# Patient Record
Sex: Male | Born: 1950 | Race: White | Hispanic: No | Marital: Married
Health system: Southern US, Community
[De-identification: ages and names within clinical notes are randomized; demographics above are authoritative.]

---

## 2000-03-29 ENCOUNTER — Encounter: Payer: Self-pay | Admitting: Internal Medicine

## 2000-03-29 ENCOUNTER — Ambulatory Visit (HOSPITAL_COMMUNITY): Admission: RE | Admit: 2000-03-29 | Discharge: 2000-03-29 | Payer: Self-pay | Admitting: Internal Medicine

## 2003-08-16 ENCOUNTER — Ambulatory Visit (HOSPITAL_COMMUNITY): Admission: RE | Admit: 2003-08-16 | Discharge: 2003-08-16 | Payer: Self-pay | Admitting: Internal Medicine

## 2003-08-30 ENCOUNTER — Ambulatory Visit (HOSPITAL_COMMUNITY): Admission: RE | Admit: 2003-08-30 | Discharge: 2003-08-30 | Payer: Self-pay | Admitting: Cardiovascular Disease

## 2009-02-01 ENCOUNTER — Encounter: Admission: RE | Admit: 2009-02-01 | Discharge: 2009-02-01 | Payer: Self-pay | Admitting: Internal Medicine

## 2010-11-05 ENCOUNTER — Encounter: Payer: Self-pay | Admitting: Internal Medicine

## 2011-03-02 NOTE — Cardiovascular Report (Signed)
NAME:  Brandon Washington, Brandon Washington                            ACCOUNT NO.:  [id number]   MEDICAL RECORD NO.:  [id number]                   PATIENT TYPE:  OIB   LOCATION:  2899                                 FACILITY:  [facility]   PHYSICIAN:  Richard A. Alanda Amass, M.D.          DATE OF BIRTH:  12-17-1950   DATE OF PROCEDURE:  08/30/2003  DATE OF DISCHARGE:                              CARDIAC CATHETERIZATION   PROCEDURE:  1. Retrograde central aortic catheterization.  2. Selective coronary angiography by Judkins technique.  3. Left ventricular angiogram RAO/LAO projection.  4. Abdominal angiogram mid stream PA projection.  5. Right iliac angiogram oblique projection.  6. VasoSeal 6-French closure device CRFA successful.   DESCRIPTION OF PROCEDURE:  The patient was brought to the second floor CP  laboratory in a postabsorptive state after 5 mg Valium p.o. pre medication.  The right groin was prepped, draped in the usual manner.  1% Xylocaine was  used for local anesthesia.  The patient was given 2 mg of Versed IV for  sedation in the laboratory.  The CRFA was entered with anterior puncture  using 18 thin wall needle and a 6-French __________ sidearm sheath which was  inserted without difficulty.  Catheterization was performed with 6-French 4  cm taper preformed Cordis coronary and pigtail catheters using Omnipaque dye  throughout the procedure.  Automated injections were done with the Bracco  automated injection device using Omnipaque dye throughout the procedure.  LV  angiogram was done in the RAO and LAO projection at 25 mL 14 mL/second of  which approximately 16 mL was delivered.  LAO injection was done at  approximately the same setting.  Pullback pressure of the CA showed no  gradient across the aortic valve.  Abdominal aortic angiogram was done in  the mid stream PA projection above the level of the renal arteries at 25 mL  20 mL/second.  Catheter was removed.  Sidearm sheath was flushed and  the  CRFA puncture was closed with a 6-French AngioSeal closure device  successfully.  The patient was transferred to the holding area for  postoperative care in stable condition with intact right lower extremity  pulses.   IMPRESSION:  LV 115/0.  LVEDP 16 mmHg.   CA 115/70 mmHg.   There is no gradient across the aortic valve on catheter pullback.   LV angiogram in the RAO and LAO projection revealed a normally contracting  ventricle with EF greater than 55%.  No wall motion abnormality.  No mitral  regurgitation.  Normal sized ventricle.   Abdominal aortic angiogram revealed single normal renal arteries  bilaterally, normal infrarenal abdominal aorta with no significant  atherosclerosis, no aneurysm or stenosis.  Normal common external iliacs and  intact hypogastrics bilaterally.  Appeared to be normal runoff and normal  SFA profunda junction bilaterally.  Celiac and SMA accesses were normal and  IMA was intact.   Fluoroscopy did not  reveal any coronary, intracardiac, or valvular  calcification.   The main left coronary was normal.   The LAD was widely patent, smooth, and normal, coursed to the apex of the  heart where it bifurcated.  There was a moderate sized septal perforator  from the proximal third and a large trifurcating diagonal branch just beyond  this that was normal.   There was a large optional diagonal vessel (trifurcation) that trifurcated  and was normal.   The circumflex was nondominant, small marginal and PABG branch that was  normal.   The RCA was a dominant vessel, widely patent, smooth throughout its course.  PDA and PLA were normal as were three RV branches from the mid RCA.   DISCUSSION:  This pleasant 60 year old white married father of three is a  nonsmoker, has hyperlipidemia, and a family history of abdominal aortic  aneurysm (father and two uncles had repair).  He is a runner and up until  the last year he has noted some relative dyspnea on  exertion.  He has been  unable to complete some of his runs which have been two to three miles and  he has cut these back.  He works at [employer] full-time and is a medical  patient of Erskine Speed, M.D.   Outpatient exercise test showed good functional capacity up to 14 minutes  with 2-3 mm of horizontal ST depression at peak exercise, resolving quickly  in the recovery period without significant chest pain.  He underwent  diagnostic catheterization in this setting.  He also had soft right femoral  bruit suggesting possible early peripheral arterial disease.  He is on Zocor  and aspirin.   Catheterization shows normal coronary arteries and LV, no evidence of spasm  or atherosclerotic disease.  There was some minimal irregularities of the  right external iliac artery beyond the sheath insertion which may represent  early atherosclerosis.   In view of his strong family history, I would recommend continued lipid  lowering therapy and aspirin prophylaxis despite his normal anatomy long-  term.  I would probably consider follow-up Cardiolite testing in five years  and recommend repeat abdominal ultrasound at about that time as well because  of his family history.  Reassurance at present.  The etiology of his  relative decline in functional capacity is not clear.   CATHETERIZATION DIAGNOSES:  1. False positive treadmill exercise test with good functional capacity.  2. Normal coronary arteries and left ventricle.  3.     Hyperlipidemia.  4. Family history of abdominal aortic aneurysm.  5. Asymptomatic right femoral bruit with minimal atherosclerotic disease     angiographically.                                               Richard A. Alanda Amass, M.D.    RAW/MEDQ  D:  08/30/2003  T:  08/30/2003  Job:  [id number]   cc:   CP Lab   Erskine Speed, M.D.  [address]  [place]  [place] [address]  Fax: [phone]

## 2013-04-18 ENCOUNTER — Ambulatory Visit (HOSPITAL_COMMUNITY)
Admission: RE | Admit: 2013-04-18 | Discharge: 2013-04-18 | Disposition: A | Discharge status: Home / Self Care | Payer: BC Managed Care – PPO | Source: Ambulatory Visit | Attending: Internal Medicine | Admitting: Internal Medicine

## 2013-04-18 ENCOUNTER — Other Ambulatory Visit (HOSPITAL_COMMUNITY): Payer: Self-pay | Admitting: Internal Medicine

## 2013-04-18 DIAGNOSIS — R52 Pain, unspecified: Secondary | ICD-10-CM

## 2013-04-18 DIAGNOSIS — M25429 Effusion, unspecified elbow: Secondary | ICD-10-CM | POA: Insufficient documentation

## 2013-04-18 DIAGNOSIS — M25539 Pain in unspecified wrist: Secondary | ICD-10-CM | POA: Insufficient documentation

## 2013-04-18 DIAGNOSIS — M25529 Pain in unspecified elbow: Secondary | ICD-10-CM | POA: Insufficient documentation

## 2013-04-18 DIAGNOSIS — S52123A Displaced fracture of head of unspecified radius, initial encounter for closed fracture: Secondary | ICD-10-CM | POA: Insufficient documentation

## 2013-04-18 DIAGNOSIS — S62009A Unspecified fracture of navicular [scaphoid] bone of unspecified wrist, initial encounter for closed fracture: Secondary | ICD-10-CM | POA: Insufficient documentation

## 2013-05-18 ENCOUNTER — Other Ambulatory Visit: Payer: Self-pay | Admitting: Orthopedic Surgery

## 2013-05-18 DIAGNOSIS — M25532 Pain in left wrist: Secondary | ICD-10-CM

## 2013-05-21 ENCOUNTER — Inpatient Hospital Stay
Admission: RE | Admit: 2013-05-21 | Discharge: 2013-05-21 | Disposition: A | Discharge status: Home / Self Care | Payer: BC Managed Care – PPO | Source: Ambulatory Visit | Attending: Orthopedic Surgery | Admitting: Orthopedic Surgery

## 2017-03-06 DIAGNOSIS — Z0000 Encounter for general adult medical examination without abnormal findings: Secondary | ICD-10-CM | POA: Diagnosis not present

## 2017-03-06 DIAGNOSIS — E785 Hyperlipidemia, unspecified: Secondary | ICD-10-CM | POA: Diagnosis not present

## 2017-03-06 DIAGNOSIS — Z6841 Body Mass Index (BMI) 40.0 and over, adult: Secondary | ICD-10-CM | POA: Diagnosis not present

## 2017-06-11 ENCOUNTER — Other Ambulatory Visit: Payer: Self-pay | Admitting: Internal Medicine

## 2017-06-11 ENCOUNTER — Ambulatory Visit
Admission: RE | Admit: 2017-06-11 | Discharge: 2017-06-11 | Disposition: A | Discharge status: Home / Self Care | Payer: PRIVATE HEALTH INSURANCE | Source: Ambulatory Visit | Attending: Internal Medicine | Admitting: Internal Medicine

## 2017-06-11 DIAGNOSIS — R599 Enlarged lymph nodes, unspecified: Secondary | ICD-10-CM

## 2017-06-11 DIAGNOSIS — R221 Localized swelling, mass and lump, neck: Secondary | ICD-10-CM | POA: Diagnosis not present

## 2017-06-11 MED ORDER — IOPAMIDOL (ISOVUE-300) INJECTION 61%
75.0000 mL | Freq: Once | INTRAVENOUS | Status: AC | PRN
  Administered 2017-06-11: 75 mL via INTRAVENOUS

## 2017-07-10 DIAGNOSIS — L565 Disseminated superficial actinic porokeratosis (DSAP): Secondary | ICD-10-CM | POA: Diagnosis not present

## 2017-07-10 DIAGNOSIS — L820 Inflamed seborrheic keratosis: Secondary | ICD-10-CM | POA: Diagnosis not present

## 2017-07-10 DIAGNOSIS — L281 Prurigo nodularis: Secondary | ICD-10-CM | POA: Diagnosis not present

## 2017-07-10 DIAGNOSIS — L821 Other seborrheic keratosis: Secondary | ICD-10-CM | POA: Diagnosis not present

## 2018-03-11 DIAGNOSIS — Z6841 Body Mass Index (BMI) 40.0 and over, adult: Secondary | ICD-10-CM | POA: Diagnosis not present

## 2018-03-11 DIAGNOSIS — E785 Hyperlipidemia, unspecified: Secondary | ICD-10-CM | POA: Diagnosis not present

## 2018-03-11 DIAGNOSIS — Z0000 Encounter for general adult medical examination without abnormal findings: Secondary | ICD-10-CM | POA: Diagnosis not present

## 2018-03-11 DIAGNOSIS — Z1211 Encounter for screening for malignant neoplasm of colon: Secondary | ICD-10-CM | POA: Diagnosis not present

## 2019-07-16 DIAGNOSIS — F4323 Adjustment disorder with mixed anxiety and depressed mood: Secondary | ICD-10-CM | POA: Diagnosis not present

## 2019-07-20 DIAGNOSIS — Z1211 Encounter for screening for malignant neoplasm of colon: Secondary | ICD-10-CM | POA: Diagnosis not present

## 2019-07-20 DIAGNOSIS — E7800 Pure hypercholesterolemia, unspecified: Secondary | ICD-10-CM | POA: Diagnosis not present

## 2019-07-20 DIAGNOSIS — Z23 Encounter for immunization: Secondary | ICD-10-CM | POA: Diagnosis not present

## 2019-07-20 DIAGNOSIS — N401 Enlarged prostate with lower urinary tract symptoms: Secondary | ICD-10-CM | POA: Diagnosis not present

## 2019-09-25 DIAGNOSIS — Z1159 Encounter for screening for other viral diseases: Secondary | ICD-10-CM | POA: Diagnosis not present

## 2019-11-04 ENCOUNTER — Ambulatory Visit: Payer: PRIVATE HEALTH INSURANCE | Attending: Internal Medicine

## 2019-11-04 DIAGNOSIS — Z23 Encounter for immunization: Secondary | ICD-10-CM | POA: Insufficient documentation

## 2019-11-04 NOTE — Progress Notes (Signed)
   Covid-19 Vaccination Clinic  Name:  KAYZEN KENDZIERSKI    MRN: [id number] DOB: [birth year]  11/04/2019  Mr. Amsden was observed post Covid-19 immunization for 15 minutes without incidence. He was provided with Vaccine Information Sheet and instruction to access the V-Safe system.   Mr. Quincy was instructed to call 911 with any severe reactions post vaccine: Marland Kitchen Difficulty breathing  . Swelling of your face and throat  . A fast heartbeat  . A bad rash all over your body  . Dizziness and weakness    Immunizations Administered    Name Date Dose VIS Date Route   Pfizer COVID-19 Vaccine 11/04/2019  8:47 AM 0.3 mL 09/25/2019 Intramuscular   Manufacturer: [employer], [employer]   Lot: [id number]   NDC: [address]-3

## 2019-11-22 ENCOUNTER — Ambulatory Visit: Payer: Self-pay | Attending: Internal Medicine

## 2019-11-22 DIAGNOSIS — Z23 Encounter for immunization: Secondary | ICD-10-CM | POA: Insufficient documentation

## 2019-11-22 NOTE — Progress Notes (Signed)
   Covid-19 Vaccination Clinic  Name:  Brandon Washington    MRN: [id number] DOB: 26-May-1951  11/22/2019  [name] was observed post Covid-19 immunization for 15 minutes without incidence. He was provided with Vaccine Information Sheet and instruction to access the V-Safe system.   [name] was instructed to call 911 with any severe reactions post vaccine: Marland Kitchen Difficulty breathing  . Swelling of your face and throat  . A fast heartbeat  . A bad rash all over your body  . Dizziness and weakness    Immunizations Administered    Name Date Dose VIS Date Route   Pfizer COVID-19 Vaccine 11/22/2019 12:43 PM 0.3 mL 09/25/2019 Intramuscular   Manufacturer: [employer], [employer]   Lot: [id number]   NDC: [address]-1

## 2020-01-07 DIAGNOSIS — E7849 Other hyperlipidemia: Secondary | ICD-10-CM | POA: Diagnosis not present

## 2020-01-07 DIAGNOSIS — Z8249 Family history of ischemic heart disease and other diseases of the circulatory system: Secondary | ICD-10-CM | POA: Diagnosis not present

## 2020-01-07 DIAGNOSIS — Z1331 Encounter for screening for depression: Secondary | ICD-10-CM | POA: Diagnosis not present

## 2020-01-12 ENCOUNTER — Other Ambulatory Visit: Payer: Self-pay | Admitting: Internal Medicine

## 2020-01-12 DIAGNOSIS — Z8249 Family history of ischemic heart disease and other diseases of the circulatory system: Secondary | ICD-10-CM

## 2020-01-12 DIAGNOSIS — E785 Hyperlipidemia, unspecified: Secondary | ICD-10-CM

## 2020-01-13 ENCOUNTER — Ambulatory Visit
Admission: RE | Admit: 2020-01-13 | Discharge: 2020-01-13 | Disposition: A | Discharge status: Home / Self Care | Payer: BC Managed Care – PPO | Source: Ambulatory Visit | Attending: Internal Medicine | Admitting: Internal Medicine

## 2020-01-13 DIAGNOSIS — Z8249 Family history of ischemic heart disease and other diseases of the circulatory system: Secondary | ICD-10-CM

## 2020-01-13 DIAGNOSIS — E785 Hyperlipidemia, unspecified: Secondary | ICD-10-CM | POA: Diagnosis not present

## 2020-01-15 ENCOUNTER — Other Ambulatory Visit: Payer: Self-pay | Admitting: Internal Medicine

## 2020-02-05 DIAGNOSIS — F4323 Adjustment disorder with mixed anxiety and depressed mood: Secondary | ICD-10-CM | POA: Diagnosis not present

## 2020-02-12 DIAGNOSIS — Z1159 Encounter for screening for other viral diseases: Secondary | ICD-10-CM | POA: Diagnosis not present

## 2020-02-17 DIAGNOSIS — Z1211 Encounter for screening for malignant neoplasm of colon: Secondary | ICD-10-CM | POA: Diagnosis not present

## 2020-02-17 DIAGNOSIS — K5730 Diverticulosis of large intestine without perforation or abscess without bleeding: Secondary | ICD-10-CM | POA: Diagnosis not present

## 2020-02-25 DIAGNOSIS — F4323 Adjustment disorder with mixed anxiety and depressed mood: Secondary | ICD-10-CM | POA: Diagnosis not present

## 2020-03-22 DIAGNOSIS — F4323 Adjustment disorder with mixed anxiety and depressed mood: Secondary | ICD-10-CM | POA: Diagnosis not present

## 2020-05-16 DIAGNOSIS — R6883 Chills (without fever): Secondary | ICD-10-CM | POA: Diagnosis not present

## 2020-06-27 DIAGNOSIS — Z20822 Contact with and (suspected) exposure to covid-19: Secondary | ICD-10-CM | POA: Diagnosis not present

## 2020-07-14 DIAGNOSIS — Z0000 Encounter for general adult medical examination without abnormal findings: Secondary | ICD-10-CM | POA: Diagnosis not present

## 2020-07-14 DIAGNOSIS — Z125 Encounter for screening for malignant neoplasm of prostate: Secondary | ICD-10-CM | POA: Diagnosis not present

## 2020-07-14 DIAGNOSIS — E785 Hyperlipidemia, unspecified: Secondary | ICD-10-CM | POA: Diagnosis not present

## 2020-07-21 DIAGNOSIS — E785 Hyperlipidemia, unspecified: Secondary | ICD-10-CM | POA: Diagnosis not present

## 2020-07-21 DIAGNOSIS — Z23 Encounter for immunization: Secondary | ICD-10-CM | POA: Diagnosis not present

## 2020-07-21 DIAGNOSIS — Z0000 Encounter for general adult medical examination without abnormal findings: Secondary | ICD-10-CM | POA: Diagnosis not present

## 2020-11-19 IMAGING — US US AORTA
1 series · 14 of 25 positions shown · non-contrast
Comparison: None

CLINICAL DATA: Family history of abdominal aortic aneurysm,
hyperlipidemia

EXAM:
ULTRASOUND OF ABDOMINAL AORTA
TECHNIQUE: Ultrasound examination of the abdominal aorta and proximal common
iliac arteries was performed to evaluate for aneurysm. Additional
color and Doppler images of the distal aorta were obtained to
document patency.

[Series 1: us aorta · 0.33mm/px · 14 of 27 slices shown]
[im 1/27]
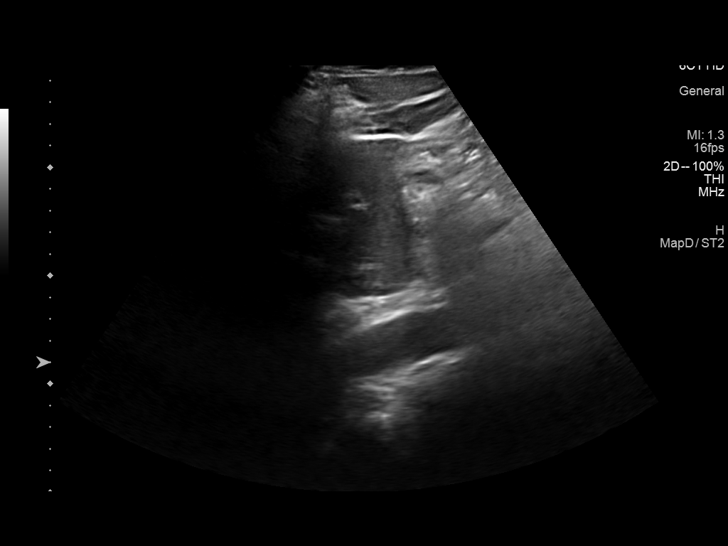
[im 3/27]
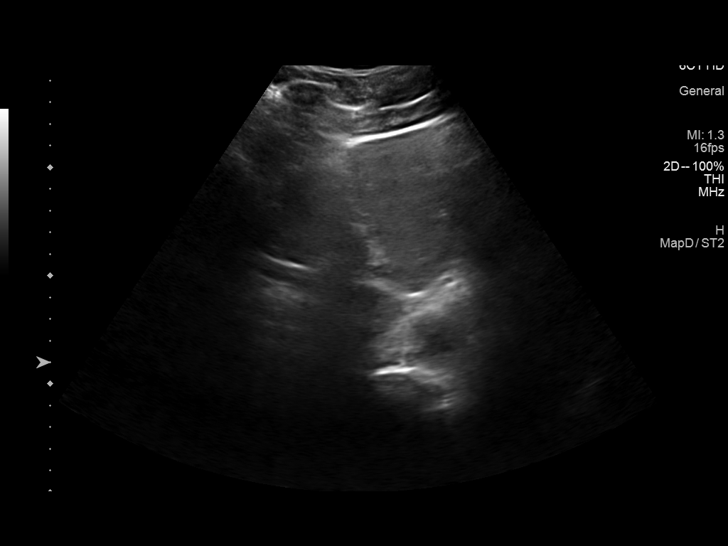
[im 5/27]
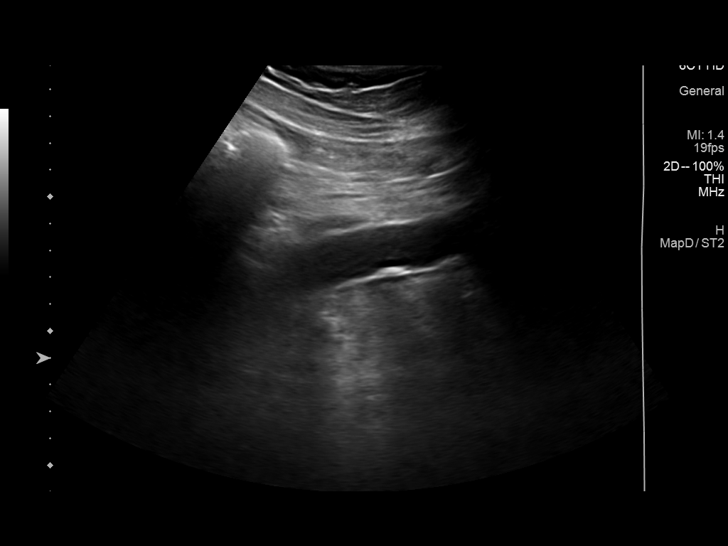
[im 7/27]
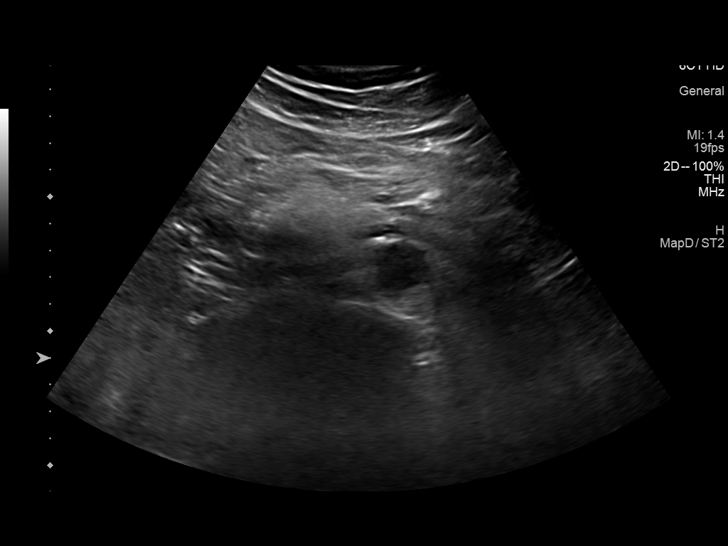
[im 9/27]
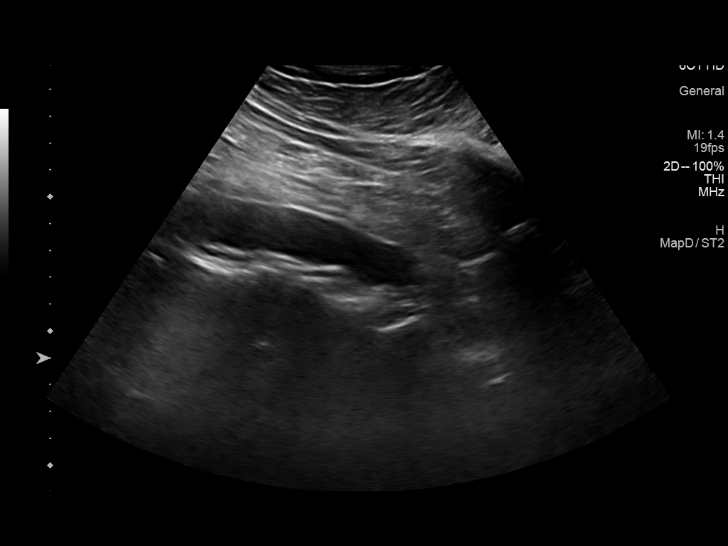
[im 10/27]
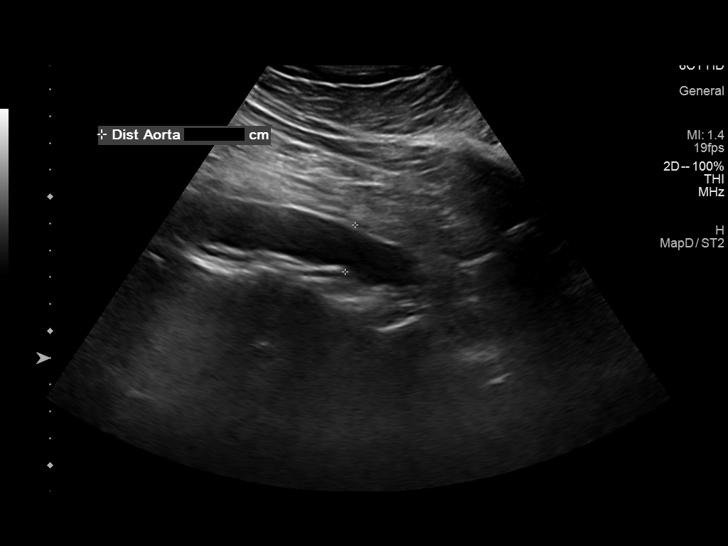
[im 12/27]
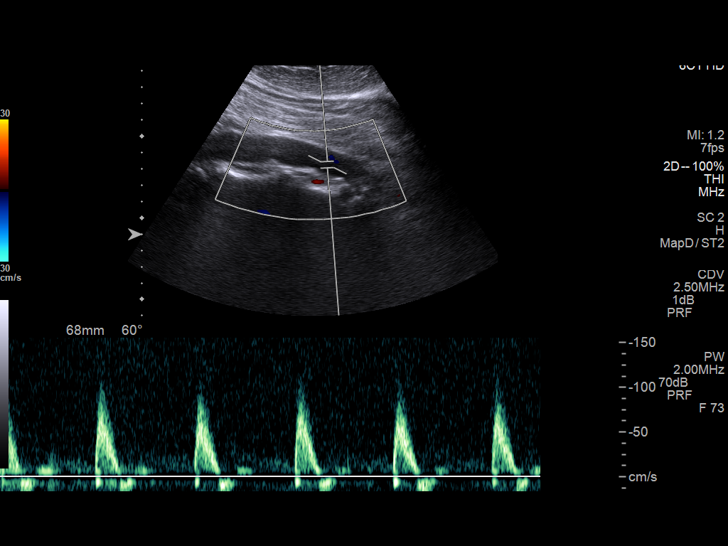
[im 15/27]
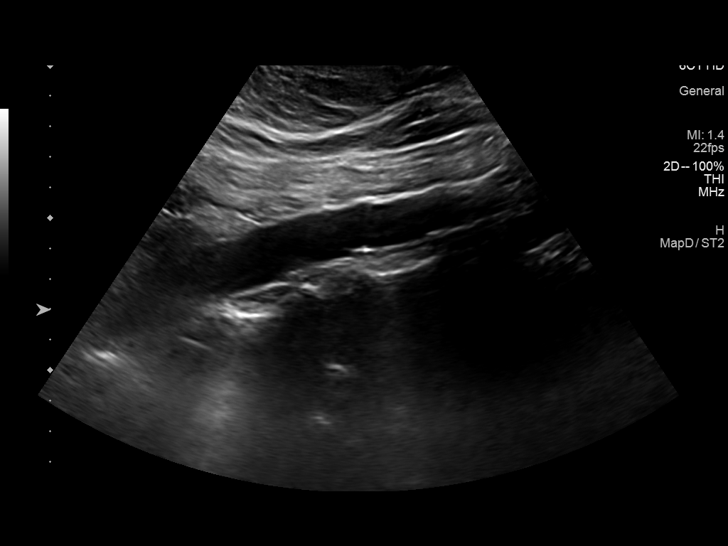
[im 17/27]
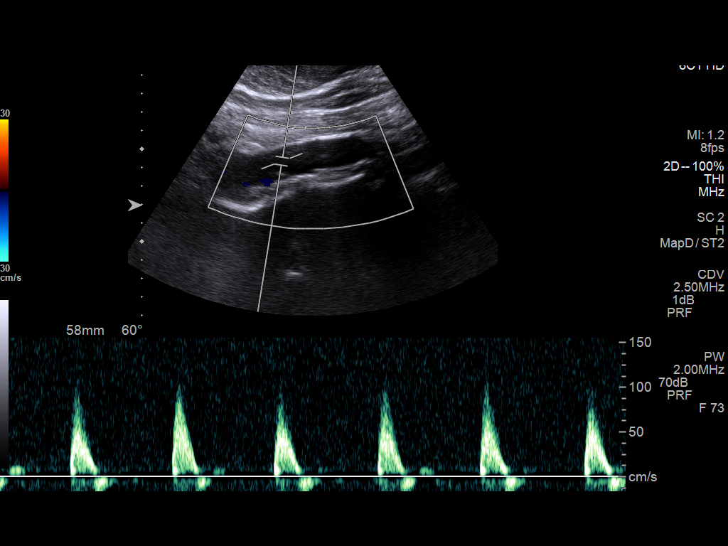
[im 18/27]
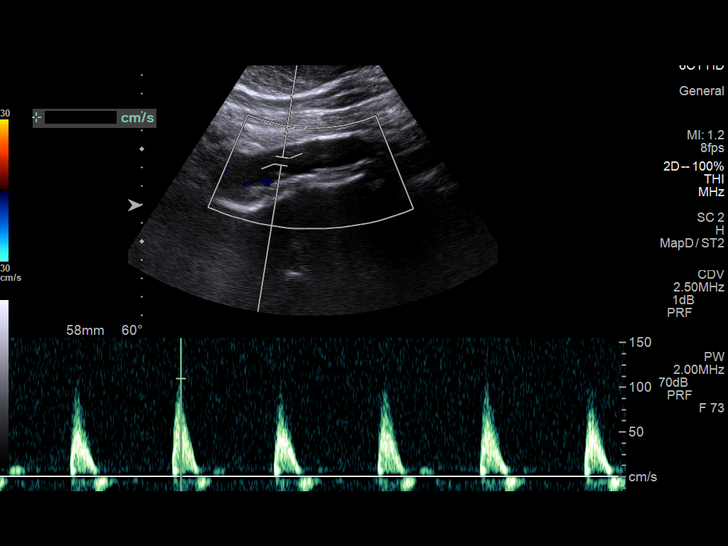
[im 20/27]
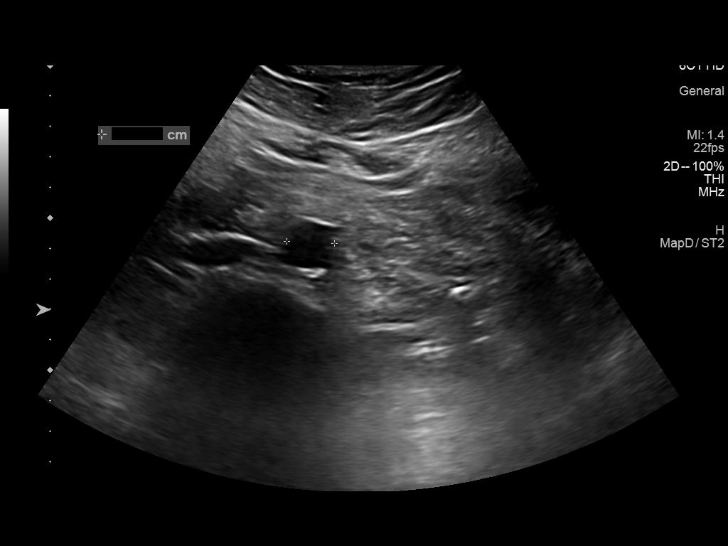
[im 22/27]
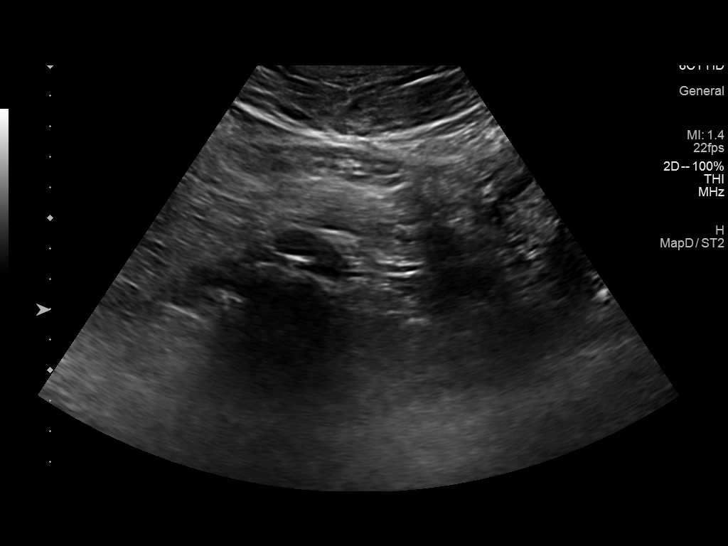
[im 24/27]
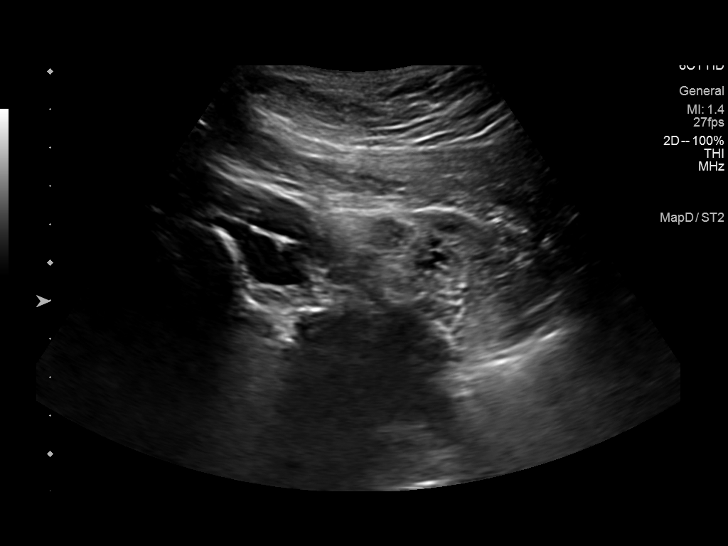
[im 27/27]
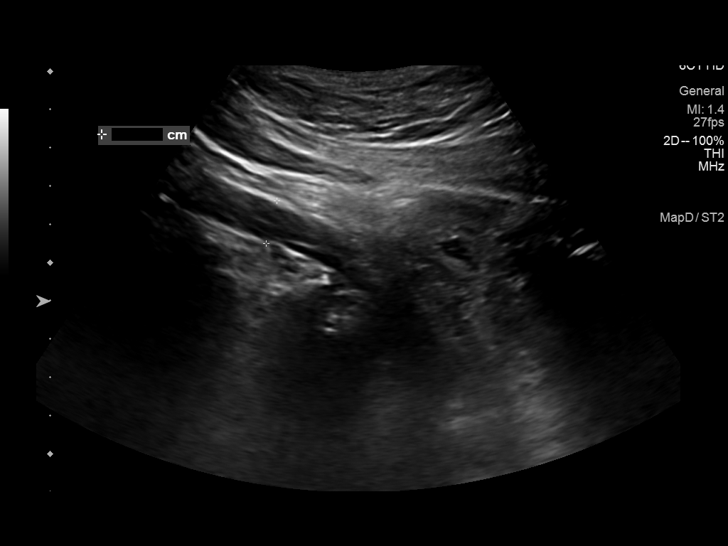

[14 of 25 positions shown; findings below may reference images not displayed]

FINDINGS: Abdominal aortic measurements as follows:

Proximal:  2.6 x 2.4 cm

Mid:  2.2 x 1.9 cm

Distal:  1.8 x 1.6 cm
Patent: Yes, peak systolic velocity is 0.09 cm/s

Right common iliac artery: 1.0 x 1.4 cm

Left common iliac artery: 1.2 x 1.2 cm
IMPRESSION: No evidence of abdominal aortic aneurysm.

## 2021-01-19 DIAGNOSIS — E785 Hyperlipidemia, unspecified: Secondary | ICD-10-CM | POA: Diagnosis not present

## 2021-03-16 DIAGNOSIS — U071 COVID-19: Secondary | ICD-10-CM | POA: Diagnosis not present

## 2021-05-16 DIAGNOSIS — K1329 Other disturbances of oral epithelium, including tongue: Secondary | ICD-10-CM | POA: Diagnosis not present

## 2021-07-26 DIAGNOSIS — Z125 Encounter for screening for malignant neoplasm of prostate: Secondary | ICD-10-CM | POA: Diagnosis not present

## 2021-07-26 DIAGNOSIS — E785 Hyperlipidemia, unspecified: Secondary | ICD-10-CM | POA: Diagnosis not present

## 2021-08-01 DIAGNOSIS — Z0000 Encounter for general adult medical examination without abnormal findings: Secondary | ICD-10-CM | POA: Diagnosis not present

## 2021-08-01 DIAGNOSIS — N5082 Scrotal pain: Secondary | ICD-10-CM | POA: Diagnosis not present

## 2021-08-01 DIAGNOSIS — Z1331 Encounter for screening for depression: Secondary | ICD-10-CM | POA: Diagnosis not present

## 2021-08-01 DIAGNOSIS — Z1339 Encounter for screening examination for other mental health and behavioral disorders: Secondary | ICD-10-CM | POA: Diagnosis not present

## 2021-08-01 DIAGNOSIS — Z23 Encounter for immunization: Secondary | ICD-10-CM | POA: Diagnosis not present

## 2021-08-11 ENCOUNTER — Other Ambulatory Visit: Payer: Self-pay | Admitting: Internal Medicine

## 2021-08-14 ENCOUNTER — Other Ambulatory Visit: Payer: Self-pay | Admitting: Internal Medicine

## 2021-08-14 DIAGNOSIS — N5082 Scrotal pain: Secondary | ICD-10-CM

## 2021-08-17 ENCOUNTER — Ambulatory Visit
Admission: RE | Admit: 2021-08-17 | Discharge: 2021-08-17 | Disposition: A | Discharge status: Home / Self Care | Payer: BC Managed Care – PPO | Source: Ambulatory Visit | Attending: Internal Medicine | Admitting: Internal Medicine

## 2021-08-17 DIAGNOSIS — N5082 Scrotal pain: Secondary | ICD-10-CM

## 2021-08-17 DIAGNOSIS — I861 Scrotal varices: Secondary | ICD-10-CM | POA: Diagnosis not present

## 2021-08-17 DIAGNOSIS — N503 Cyst of epididymis: Secondary | ICD-10-CM | POA: Diagnosis not present

## 2021-08-25 ENCOUNTER — Other Ambulatory Visit: Payer: Self-pay | Admitting: Internal Medicine

## 2021-08-25 DIAGNOSIS — N5082 Scrotal pain: Secondary | ICD-10-CM

## 2021-10-03 ENCOUNTER — Ambulatory Visit
Admission: RE | Admit: 2021-10-03 | Discharge: 2021-10-03 | Disposition: A | Discharge status: Home / Self Care | Payer: BC Managed Care – PPO | Source: Ambulatory Visit | Attending: Internal Medicine | Admitting: Internal Medicine

## 2021-10-03 DIAGNOSIS — N5082 Scrotal pain: Secondary | ICD-10-CM

## 2021-10-03 DIAGNOSIS — I861 Scrotal varices: Secondary | ICD-10-CM | POA: Diagnosis not present

## 2021-10-03 DIAGNOSIS — N503 Cyst of epididymis: Secondary | ICD-10-CM | POA: Diagnosis not present

## 2021-12-25 DIAGNOSIS — N50812 Left testicular pain: Secondary | ICD-10-CM | POA: Diagnosis not present

## 2021-12-25 DIAGNOSIS — N451 Epididymitis: Secondary | ICD-10-CM | POA: Diagnosis not present

## 2021-12-25 DIAGNOSIS — I861 Scrotal varices: Secondary | ICD-10-CM | POA: Diagnosis not present

## 2022-03-20 DIAGNOSIS — M25551 Pain in right hip: Secondary | ICD-10-CM | POA: Diagnosis not present

## 2022-03-20 DIAGNOSIS — R269 Unspecified abnormalities of gait and mobility: Secondary | ICD-10-CM | POA: Diagnosis not present

## 2022-03-28 ENCOUNTER — Other Ambulatory Visit: Payer: Self-pay | Admitting: Internal Medicine

## 2022-03-28 DIAGNOSIS — M25551 Pain in right hip: Secondary | ICD-10-CM | POA: Diagnosis not present

## 2022-03-28 DIAGNOSIS — R269 Unspecified abnormalities of gait and mobility: Secondary | ICD-10-CM | POA: Diagnosis not present

## 2022-03-28 DIAGNOSIS — N503 Cyst of epididymis: Secondary | ICD-10-CM

## 2022-04-04 DIAGNOSIS — M25551 Pain in right hip: Secondary | ICD-10-CM | POA: Diagnosis not present

## 2022-04-04 DIAGNOSIS — R269 Unspecified abnormalities of gait and mobility: Secondary | ICD-10-CM | POA: Diagnosis not present

## 2022-04-11 DIAGNOSIS — M25551 Pain in right hip: Secondary | ICD-10-CM | POA: Diagnosis not present

## 2022-04-11 DIAGNOSIS — R269 Unspecified abnormalities of gait and mobility: Secondary | ICD-10-CM | POA: Diagnosis not present

## 2022-04-25 DIAGNOSIS — R269 Unspecified abnormalities of gait and mobility: Secondary | ICD-10-CM | POA: Diagnosis not present

## 2022-04-25 DIAGNOSIS — M25551 Pain in right hip: Secondary | ICD-10-CM | POA: Diagnosis not present

## 2022-08-07 DIAGNOSIS — E785 Hyperlipidemia, unspecified: Secondary | ICD-10-CM | POA: Diagnosis not present

## 2022-08-07 DIAGNOSIS — R7989 Other specified abnormal findings of blood chemistry: Secondary | ICD-10-CM | POA: Diagnosis not present

## 2022-08-07 DIAGNOSIS — Z125 Encounter for screening for malignant neoplasm of prostate: Secondary | ICD-10-CM | POA: Diagnosis not present

## 2022-08-10 IMAGING — US US SCROTUM W/ DOPPLER COMPLETE
1 series · 13 of 25 positions shown · non-contrast
Comparison: Ultrasound 08/17/2021

CLINICAL DATA: Scrotal pain. REPEAT TESTICULAR US IN 6 WEEKS D/T
CYST ON EPIDIDYMIS ON PRIOR US

EXAM:
SCROTAL ULTRASOUND
DOPPLER ULTRASOUND OF THE TESTICLES
TECHNIQUE: Complete ultrasound examination of the testicles, epididymis, and
other scrotal structures was performed. Color and spectral Doppler
ultrasound were also utilized to evaluate blood flow to the
testicles.

[Series 1: us scrotum w/ doppler complete · 0.06mm/px · 13 of 66 slices shown]
[im 1/66]
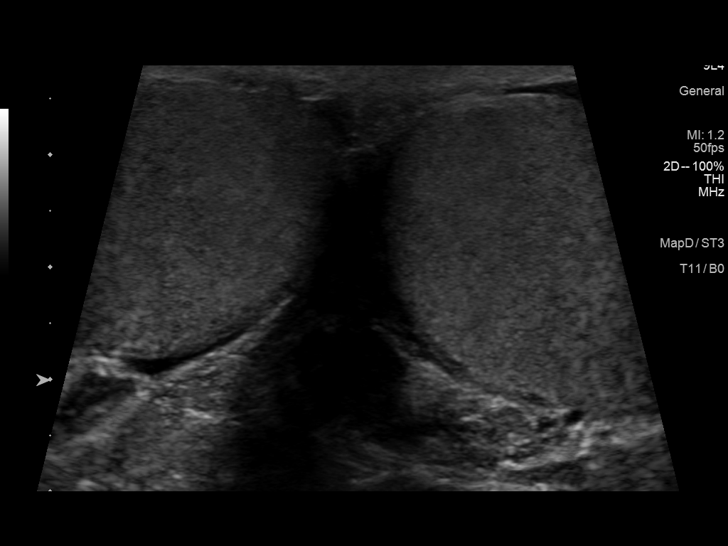
[im 6/66]
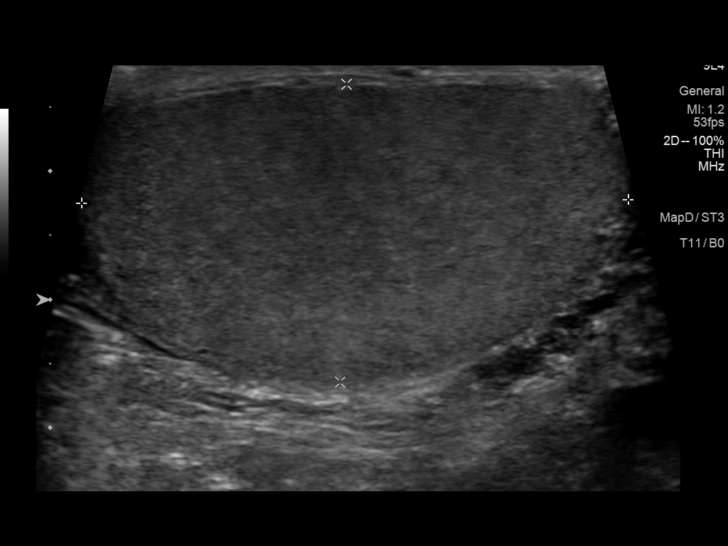
[im 11/66]
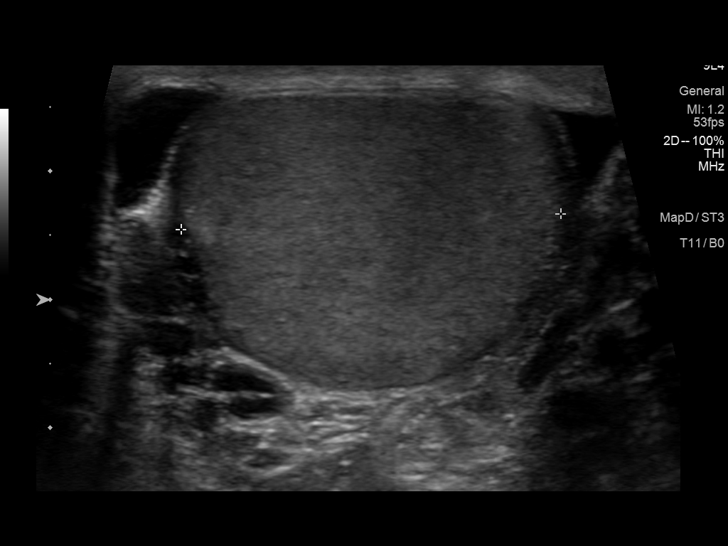
[im 17/66]
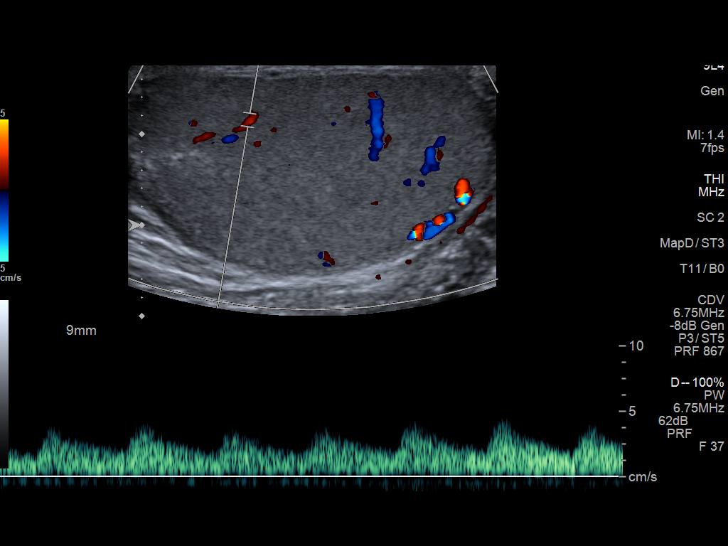
[im 22/66]
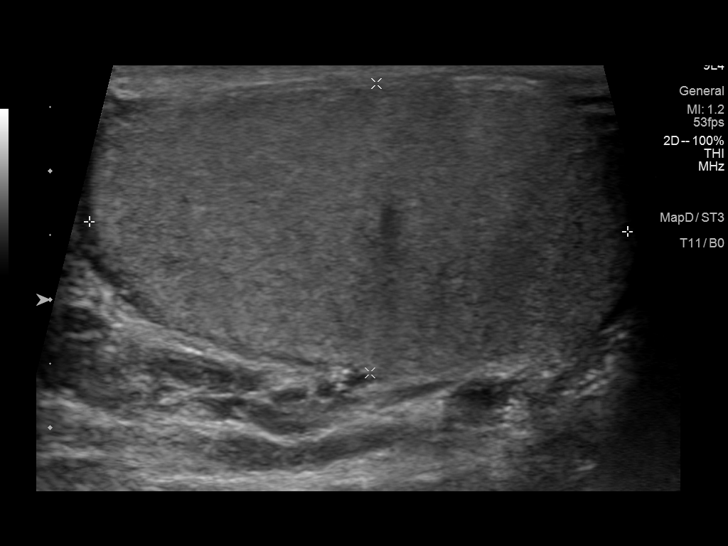
[im 28/66]
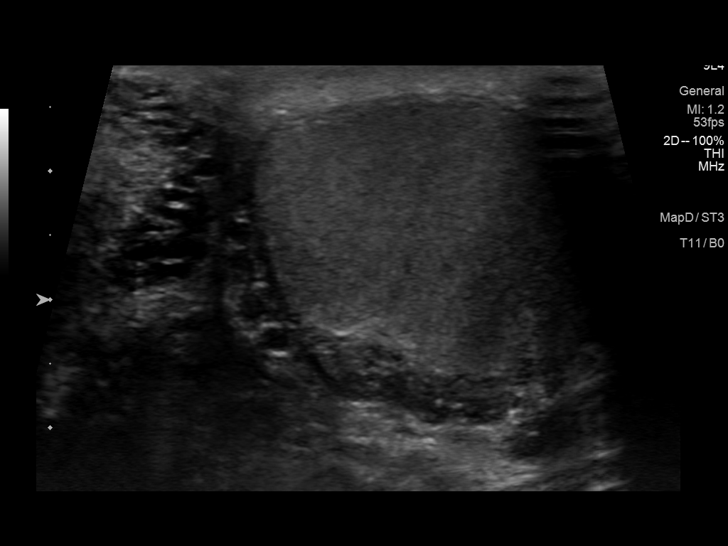
[im 33/66]
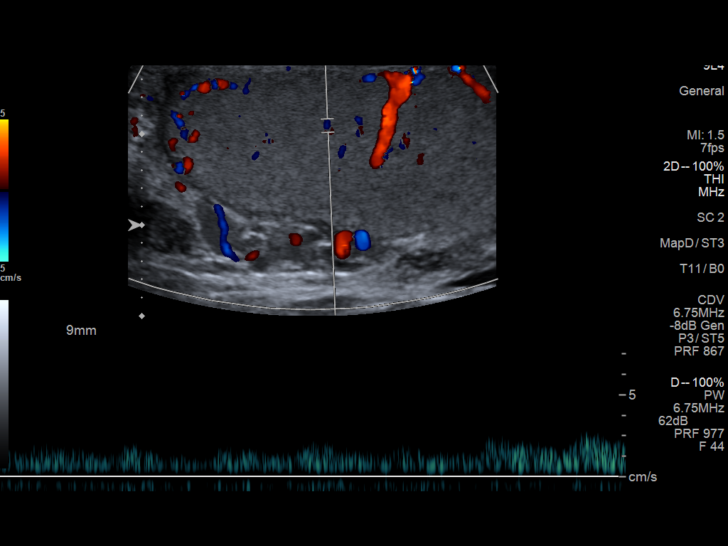
[im 38/66]
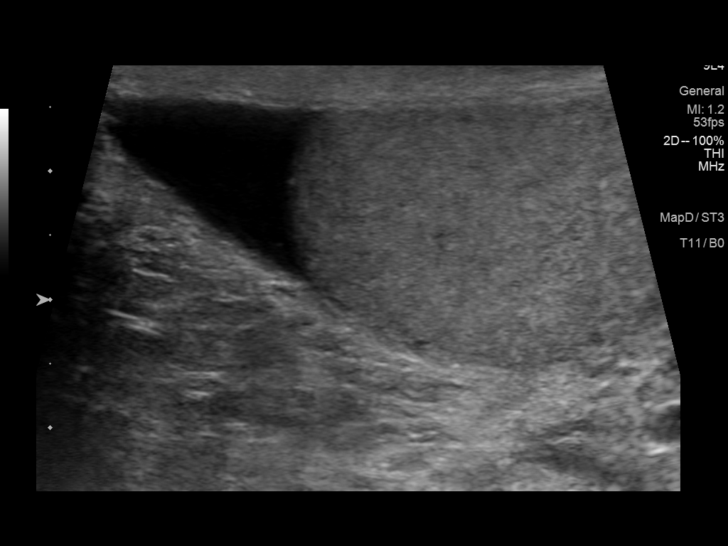
[im 44/66]
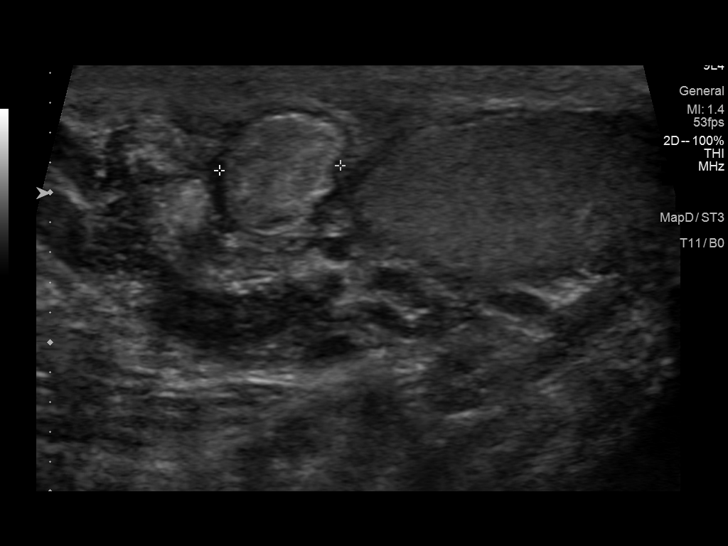
[im 49/66]
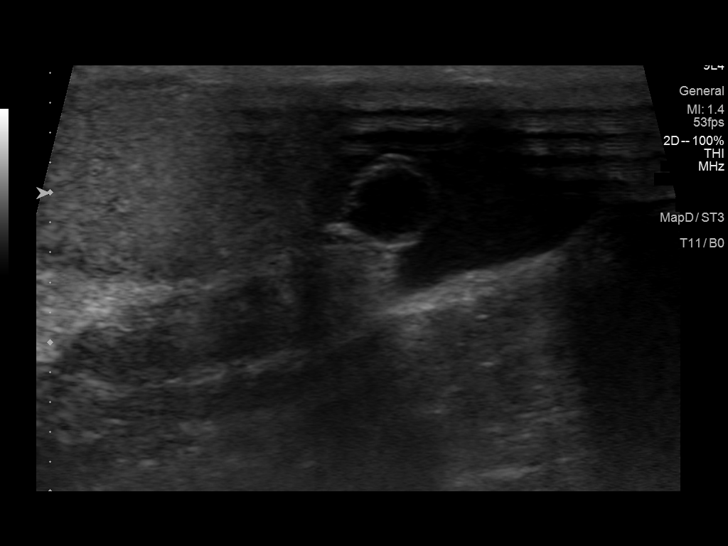
[im 55/66]
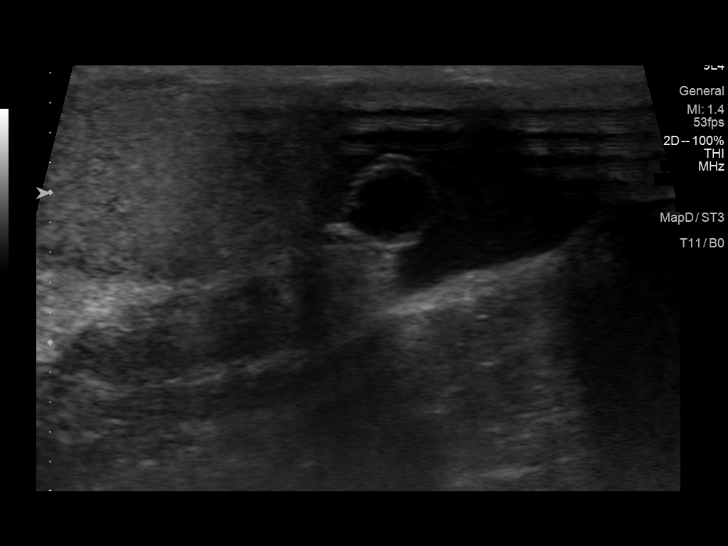
[im 60/66]
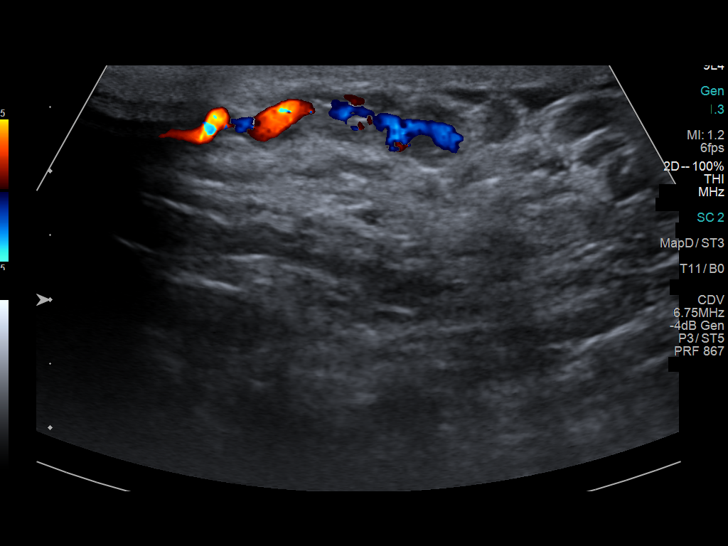
[im 66/66]
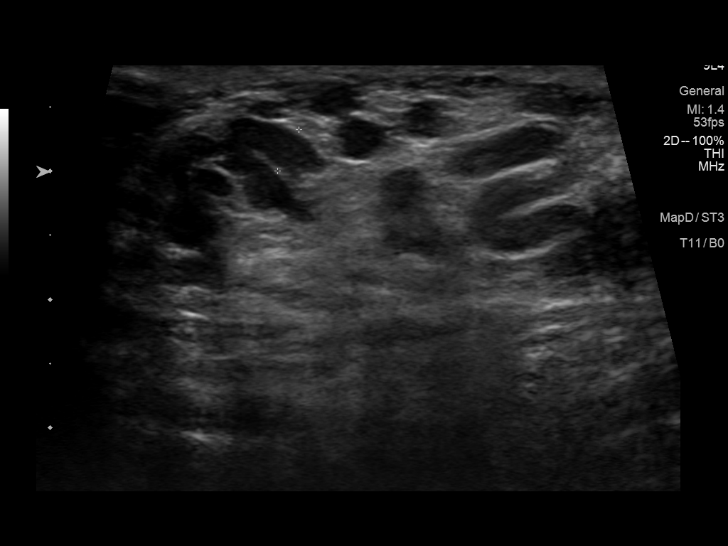

[13 of 25 positions shown; findings below may reference images not displayed]

FINDINGS: Right testicle

Measurements: 4.3 x 2.3 x 3.0 cm. No mass or microlithiasis
visualized.

Left testicle

Measurements: 4.2 x 2.3 x 2.4 cm. No mass or microlithiasis
visualized.

Right epididymis:  Normal in size and appearance.

Left epididymis: There is a hyperechoic epididymal nodule measuring
0.8 x 0.8 x 0.7 cm, in the region of prior cystic lesion. The cystic
component is no longer seen. Additional tiny epididymal cyst
measuring 5 mm is unchanged.

Hydrocele:  None visualized.

Varicocele:  Small left-sided varicocele.

Pulsed Doppler interrogation of both testes demonstrates normal low
resistance arterial and venous waveforms bilaterally.
IMPRESSION: Hyperechoic epididymal nodule measuring 0.8 cm in the region of the
previously seen cystic lesion, which is no longer seen. This lesion
is almost certainly benign, and could represent residual nodular
tissue from prior inflammatory process or an extratesticular lipoma.
Recommend clinical follow-up. Repeat ultrasound could be considered
in 6 months to ensure stability.

Left-sided varicocele.

Normal appearance of the testicles.

## 2022-08-14 DIAGNOSIS — Z23 Encounter for immunization: Secondary | ICD-10-CM | POA: Diagnosis not present

## 2022-08-14 DIAGNOSIS — Z0000 Encounter for general adult medical examination without abnormal findings: Secondary | ICD-10-CM | POA: Diagnosis not present

## 2022-08-14 DIAGNOSIS — E785 Hyperlipidemia, unspecified: Secondary | ICD-10-CM | POA: Diagnosis not present

## 2022-08-14 DIAGNOSIS — Z1339 Encounter for screening examination for other mental health and behavioral disorders: Secondary | ICD-10-CM | POA: Diagnosis not present

## 2022-08-14 DIAGNOSIS — Z1331 Encounter for screening for depression: Secondary | ICD-10-CM | POA: Diagnosis not present

## 2022-10-25 ENCOUNTER — Telehealth (HOSPITAL_COMMUNITY): Payer: Self-pay | Admitting: Vascular Surgery

## 2022-10-25 NOTE — Telephone Encounter (Signed)
PT is not appropriate for [facility], LVM to referring office to [facility]

## 2022-12-24 DIAGNOSIS — N5201 Erectile dysfunction due to arterial insufficiency: Secondary | ICD-10-CM | POA: Diagnosis not present

## 2022-12-24 DIAGNOSIS — R351 Nocturia: Secondary | ICD-10-CM | POA: Diagnosis not present

## 2023-05-29 DIAGNOSIS — B354 Tinea corporis: Secondary | ICD-10-CM | POA: Diagnosis not present

## 2023-05-29 DIAGNOSIS — D1801 Hemangioma of skin and subcutaneous tissue: Secondary | ICD-10-CM | POA: Diagnosis not present

## 2023-05-29 DIAGNOSIS — L821 Other seborrheic keratosis: Secondary | ICD-10-CM | POA: Diagnosis not present

## 2023-05-29 DIAGNOSIS — H61003 Unspecified perichondritis of external ear, bilateral: Secondary | ICD-10-CM | POA: Diagnosis not present

## 2023-05-29 DIAGNOSIS — L819 Disorder of pigmentation, unspecified: Secondary | ICD-10-CM | POA: Diagnosis not present

## 2023-05-29 DIAGNOSIS — D225 Melanocytic nevi of trunk: Secondary | ICD-10-CM | POA: Diagnosis not present

## 2023-05-29 DIAGNOSIS — D2262 Melanocytic nevi of left upper limb, including shoulder: Secondary | ICD-10-CM | POA: Diagnosis not present

## 2023-05-29 DIAGNOSIS — B360 Pityriasis versicolor: Secondary | ICD-10-CM | POA: Diagnosis not present

## 2023-08-13 DIAGNOSIS — Z1389 Encounter for screening for other disorder: Secondary | ICD-10-CM | POA: Diagnosis not present

## 2023-08-13 DIAGNOSIS — E785 Hyperlipidemia, unspecified: Secondary | ICD-10-CM | POA: Diagnosis not present

## 2023-08-13 DIAGNOSIS — Z125 Encounter for screening for malignant neoplasm of prostate: Secondary | ICD-10-CM | POA: Diagnosis not present

## 2023-08-20 DIAGNOSIS — M180 Bilateral primary osteoarthritis of first carpometacarpal joints: Secondary | ICD-10-CM | POA: Diagnosis not present

## 2023-08-20 DIAGNOSIS — Z8249 Family history of ischemic heart disease and other diseases of the circulatory system: Secondary | ICD-10-CM | POA: Diagnosis not present

## 2023-08-20 DIAGNOSIS — Z0000 Encounter for general adult medical examination without abnormal findings: Secondary | ICD-10-CM | POA: Diagnosis not present

## 2023-08-20 DIAGNOSIS — Z23 Encounter for immunization: Secondary | ICD-10-CM | POA: Diagnosis not present

## 2023-08-20 DIAGNOSIS — Z1331 Encounter for screening for depression: Secondary | ICD-10-CM | POA: Diagnosis not present

## 2023-08-20 DIAGNOSIS — E785 Hyperlipidemia, unspecified: Secondary | ICD-10-CM | POA: Diagnosis not present

## 2023-08-20 DIAGNOSIS — Z1339 Encounter for screening examination for other mental health and behavioral disorders: Secondary | ICD-10-CM | POA: Diagnosis not present

## 2024-03-04 DIAGNOSIS — F4323 Adjustment disorder with mixed anxiety and depressed mood: Secondary | ICD-10-CM | POA: Diagnosis not present

## 2024-03-25 DIAGNOSIS — F4323 Adjustment disorder with mixed anxiety and depressed mood: Secondary | ICD-10-CM | POA: Diagnosis not present

## 2024-04-06 DIAGNOSIS — F4323 Adjustment disorder with mixed anxiety and depressed mood: Secondary | ICD-10-CM | POA: Diagnosis not present

## 2024-04-08 DIAGNOSIS — N401 Enlarged prostate with lower urinary tract symptoms: Secondary | ICD-10-CM | POA: Diagnosis not present

## 2024-04-08 DIAGNOSIS — N5201 Erectile dysfunction due to arterial insufficiency: Secondary | ICD-10-CM | POA: Diagnosis not present

## 2024-04-08 DIAGNOSIS — R351 Nocturia: Secondary | ICD-10-CM | POA: Diagnosis not present

## 2024-04-13 ENCOUNTER — Other Ambulatory Visit: Payer: Self-pay | Admitting: Internal Medicine

## 2024-04-13 DIAGNOSIS — E785 Hyperlipidemia, unspecified: Secondary | ICD-10-CM

## 2024-04-14 DIAGNOSIS — F4323 Adjustment disorder with mixed anxiety and depressed mood: Secondary | ICD-10-CM | POA: Diagnosis not present

## 2024-04-24 ENCOUNTER — Ambulatory Visit
Admission: RE | Admit: 2024-04-24 | Discharge: 2024-04-24 | Disposition: A | Discharge status: Home / Self Care | Source: Ambulatory Visit | Attending: Internal Medicine

## 2024-04-24 DIAGNOSIS — E785 Hyperlipidemia, unspecified: Secondary | ICD-10-CM

## 2024-04-28 DIAGNOSIS — F4323 Adjustment disorder with mixed anxiety and depressed mood: Secondary | ICD-10-CM | POA: Diagnosis not present

## 2024-05-12 DIAGNOSIS — F4323 Adjustment disorder with mixed anxiety and depressed mood: Secondary | ICD-10-CM | POA: Diagnosis not present

## 2024-05-21 DIAGNOSIS — F4323 Adjustment disorder with mixed anxiety and depressed mood: Secondary | ICD-10-CM | POA: Diagnosis not present

## 2024-06-02 DIAGNOSIS — F4323 Adjustment disorder with mixed anxiety and depressed mood: Secondary | ICD-10-CM | POA: Diagnosis not present

## 2024-06-18 DIAGNOSIS — F4323 Adjustment disorder with mixed anxiety and depressed mood: Secondary | ICD-10-CM | POA: Diagnosis not present

## 2024-06-25 DIAGNOSIS — F4323 Adjustment disorder with mixed anxiety and depressed mood: Secondary | ICD-10-CM | POA: Diagnosis not present

## 2024-07-02 DIAGNOSIS — F4323 Adjustment disorder with mixed anxiety and depressed mood: Secondary | ICD-10-CM | POA: Diagnosis not present

## 2024-07-27 DIAGNOSIS — F4323 Adjustment disorder with mixed anxiety and depressed mood: Secondary | ICD-10-CM | POA: Diagnosis not present

## 2024-08-03 DIAGNOSIS — Z125 Encounter for screening for malignant neoplasm of prostate: Secondary | ICD-10-CM | POA: Diagnosis not present

## 2024-08-04 DIAGNOSIS — F4323 Adjustment disorder with mixed anxiety and depressed mood: Secondary | ICD-10-CM | POA: Diagnosis not present

## 2024-08-10 DIAGNOSIS — R399 Unspecified symptoms and signs involving the genitourinary system: Secondary | ICD-10-CM | POA: Diagnosis not present

## 2024-08-10 DIAGNOSIS — N401 Enlarged prostate with lower urinary tract symptoms: Secondary | ICD-10-CM | POA: Diagnosis not present

## 2024-08-10 DIAGNOSIS — Z125 Encounter for screening for malignant neoplasm of prostate: Secondary | ICD-10-CM | POA: Diagnosis not present

## 2024-08-10 DIAGNOSIS — R351 Nocturia: Secondary | ICD-10-CM | POA: Diagnosis not present

## 2024-08-10 DIAGNOSIS — N5201 Erectile dysfunction due to arterial insufficiency: Secondary | ICD-10-CM | POA: Diagnosis not present

## 2024-08-18 DIAGNOSIS — E785 Hyperlipidemia, unspecified: Secondary | ICD-10-CM | POA: Diagnosis not present

## 2024-08-18 DIAGNOSIS — Z79899 Other long term (current) drug therapy: Secondary | ICD-10-CM | POA: Diagnosis not present

## 2024-08-19 DIAGNOSIS — F4323 Adjustment disorder with mixed anxiety and depressed mood: Secondary | ICD-10-CM | POA: Diagnosis not present

## 2024-08-25 ENCOUNTER — Other Ambulatory Visit: Payer: Self-pay | Admitting: Internal Medicine

## 2024-08-25 DIAGNOSIS — Z8249 Family history of ischemic heart disease and other diseases of the circulatory system: Secondary | ICD-10-CM | POA: Diagnosis not present

## 2024-08-25 DIAGNOSIS — Z0000 Encounter for general adult medical examination without abnormal findings: Secondary | ICD-10-CM | POA: Diagnosis not present

## 2024-08-25 DIAGNOSIS — R932 Abnormal findings on diagnostic imaging of liver and biliary tract: Secondary | ICD-10-CM

## 2024-08-25 DIAGNOSIS — R350 Frequency of micturition: Secondary | ICD-10-CM | POA: Diagnosis not present

## 2024-08-25 DIAGNOSIS — Z1339 Encounter for screening examination for other mental health and behavioral disorders: Secondary | ICD-10-CM | POA: Diagnosis not present

## 2024-08-25 DIAGNOSIS — Z1331 Encounter for screening for depression: Secondary | ICD-10-CM | POA: Diagnosis not present

## 2024-08-25 DIAGNOSIS — E785 Hyperlipidemia, unspecified: Secondary | ICD-10-CM | POA: Diagnosis not present

## 2024-08-25 DIAGNOSIS — N5201 Erectile dysfunction due to arterial insufficiency: Secondary | ICD-10-CM | POA: Diagnosis not present

## 2024-09-01 DIAGNOSIS — F4323 Adjustment disorder with mixed anxiety and depressed mood: Secondary | ICD-10-CM | POA: Diagnosis not present

## 2024-09-02 ENCOUNTER — Ambulatory Visit
Admission: RE | Admit: 2024-09-02 | Discharge: 2024-09-02 | Disposition: A | Discharge status: Home / Self Care | Payer: Self-pay | Source: Ambulatory Visit | Attending: Internal Medicine | Admitting: Internal Medicine

## 2024-09-02 DIAGNOSIS — K7689 Other specified diseases of liver: Secondary | ICD-10-CM | POA: Diagnosis not present

## 2024-09-02 DIAGNOSIS — R932 Abnormal findings on diagnostic imaging of liver and biliary tract: Secondary | ICD-10-CM

## 2024-09-15 DIAGNOSIS — F4323 Adjustment disorder with mixed anxiety and depressed mood: Secondary | ICD-10-CM | POA: Diagnosis not present

## 2024-10-28 ENCOUNTER — Ambulatory Visit (HOSPITAL_BASED_OUTPATIENT_CLINIC_OR_DEPARTMENT_OTHER): Admitting: Internal Medicine

## 2024-10-28 ENCOUNTER — Encounter (HOSPITAL_BASED_OUTPATIENT_CLINIC_OR_DEPARTMENT_OTHER): Payer: Self-pay | Admitting: Internal Medicine

## 2024-10-28 VITALS — BP 130/74 | HR 50 | Ht 73.0 in | Wt 179.6 lb

## 2024-10-28 DIAGNOSIS — E785 Hyperlipidemia, unspecified: Secondary | ICD-10-CM

## 2024-10-28 DIAGNOSIS — E7841 Elevated Lipoprotein(a): Secondary | ICD-10-CM

## 2024-10-28 NOTE — Progress Notes (Signed)
" ° ° °  LIPID CLINIC CONSULT NOTE  Chief Complaint:  Manage dyslipidemia  Primary Care Physician: Stephane Leita DEL, MD  Primary Cardiologist:  None  HPI:  Brandon Washington is a 74 y.o. male who is being seen today for the evaluation of lipidemia at the request of Leita Stephane, MD.  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  He has a history of high cholesterol which has been fairly well treated on rosuvastatin 20 mg daily.  Recent lipids have shown LDL in the 80s and 90s.  He had a calcium score in July 2025 which was 0.  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  PMHx:  Hyperlipidemia, erectile dysfunction  FAMHx:  Possible hyperlipidemia and aortic aneurysm in his brother, father with prostate cancer, mother with congestive heart failure but lived to 18.  SOCHx:   has no history on file for tobacco use, alcohol  use, and drug use.  ALLERGIES:  Allergies[1]  ROS: Pertinent items noted in HPI and remainder of comprehensive ROS otherwise negative.  HOME MEDS: Medications Ordered Prior to Encounter[2]  LABS/IMAGING: No results found for this or any previous visit (from the past 48 hours). No results found.  LIPID PANEL: No results found for: CHOL, TRIG, HDL, CHOLHDL, VLDL, LDLCALC, LDLDIRECT  No results found for: LIPOA   WEIGHTS: Wt Readings from Last 3 Encounters:  No data found for Wt    VITALS: There were no vitals taken for this visit.  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   Mr. Voris has a dyslipidemia with target LDL less than 70.  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  Fortunately he has no coronary calcium however because of this he would likely not qualify for clinical trials which are aimed at patients that have some detectable  cardiovascular disease and typically high LP(a) over 200 or in some cases over 175 nmol/L.  He currently is on high intensity statin therapy with LDL recently of 83.  Given the risk enhancement of LP(a) and the fact that he had no coronary calcium, would recommend a target LDL less than 70.  Based on this we could consider additional therapy with a PCSK9 inhibitor as he is not at goal, however I suspect he is unlikely to get approved given no coronary calcium.  Alternatively, I expect results of the LP(a) specific lowering trials within the next 1 to 2 years at which time he may see if he could qualify for specific LP(a) lowering therapy.  Thanks again for the kind referral.  Follow-up with me as needed.  Vinie KYM Maxcy, MD, [facility], FNLA, FACP  [place]  [facility]  Medical Director of the [facility]id [facility] &  Cardiovascular Risk Reduction Clinic Diplomate of the American Board of Clinical Lipidology Attending Cardiologist  Direct Dial: [phone]  Fax: [phone]  Website:  [email or web address]  Vinie BROCKS Nobuko Gsell 10/28/2024, 1:34 PM      [1] Not on File [2]  No current outpatient medications on file prior to visit.   No current facility-administered medications on file prior to visit.   "

## 2024-10-28 NOTE — Patient Instructions (Addendum)
 Medication Instructions:   Your physician recommends that you continue on your current medications as directed. Please refer to the Current Medication list given to you today.  *If you need a refill on your cardiac medications before your next appointment, please call your pharmacy*    Follow-Up:  AS NEEDED WITH DR. HILTY IN LIPID CLINIC

## 2024-10-29 ENCOUNTER — Encounter (HOSPITAL_BASED_OUTPATIENT_CLINIC_OR_DEPARTMENT_OTHER): Payer: Self-pay | Admitting: Internal Medicine
